# Patient Record
Sex: Male | Born: 1950 | Race: White | Hispanic: No | Marital: Single | Smoking: Never smoker
Health system: Southern US, Community
[De-identification: ages and names within clinical notes are randomized; demographics above are authoritative.]

## PROBLEM LIST (undated history)

## (undated) DIAGNOSIS — I509 Heart failure, unspecified: Secondary | ICD-10-CM

## (undated) DIAGNOSIS — M1990 Unspecified osteoarthritis, unspecified site: Secondary | ICD-10-CM

## (undated) DIAGNOSIS — N189 Chronic kidney disease, unspecified: Secondary | ICD-10-CM

## (undated) DIAGNOSIS — D649 Anemia, unspecified: Secondary | ICD-10-CM

---

## 2012-03-04 ENCOUNTER — Other Ambulatory Visit: Payer: Self-pay | Admitting: Family Medicine

## 2012-03-18 ENCOUNTER — Other Ambulatory Visit: Payer: Self-pay | Admitting: Family Medicine

## 2012-03-18 LAB — CBC WITH DIFFERENTIAL/PLATELET
Basophil #: 0.1 x10 3/mm 3 (ref 0.0–0.1)
Basophil %: 1.8 %
Eosinophil #: 0.4 x10 3/mm 3 (ref 0.0–0.7)
Eosinophil %: 7.3 %
HCT: 32.4 % — ABNORMAL LOW (ref 40.0–52.0)
HGB: 10.7 g/dL — ABNORMAL LOW (ref 13.0–18.0)
Lymphocyte %: 32.1 %
Lymphs Abs: 1.8 x10 3/mm 3 (ref 1.0–3.6)
MCH: 34 pg (ref 26.0–34.0)
MCHC: 33 g/dL (ref 32.0–36.0)
MCV: 103 fL — ABNORMAL HIGH (ref 80–100)
Monocyte #: 1 x10 3/mm (ref 0.2–1.0)
Monocyte %: 18.1 %
Neutrophil #: 2.3 x10 3/mm 3 (ref 1.4–6.5)
Neutrophil %: 40.7 %
Platelet: 233 x10 3/mm 3 (ref 150–440)
RBC: 3.15 x10 6/mm 3 — ABNORMAL LOW (ref 4.40–5.90)
RDW: 14.8 % — ABNORMAL HIGH (ref 11.5–14.5)
WBC: 5.6 x10 3/mm 3 (ref 3.8–10.6)

## 2012-03-18 LAB — BASIC METABOLIC PANEL WITH GFR
Anion Gap: 6 — ABNORMAL LOW (ref 7–16)
BUN: 12 mg/dL (ref 7–18)
Calcium, Total: 8.5 mg/dL (ref 8.5–10.1)
Chloride: 102 mmol/L (ref 98–107)
Co2: 30 mmol/L (ref 21–32)
Creatinine: 2.5 mg/dL — ABNORMAL HIGH (ref 0.60–1.30)
EGFR (African American): 27 — ABNORMAL LOW
EGFR (Non-African Amer.): 23 — ABNORMAL LOW
Glucose: 89 mg/dL (ref 65–99)
Osmolality: 275 (ref 275–301)
Potassium: 4 mmol/L (ref 3.5–5.1)
Sodium: 138 mmol/L (ref 136–145)

## 2012-06-06 ENCOUNTER — Emergency Department: Payer: Self-pay | Admitting: Emergency Medicine

## 2012-06-06 LAB — COMPREHENSIVE METABOLIC PANEL
Albumin: 3 g/dL — ABNORMAL LOW (ref 3.4–5.0)
Anion Gap: 7 (ref 7–16)
BUN: 64 mg/dL — ABNORMAL HIGH (ref 7–18)
Chloride: 100 mmol/L (ref 98–107)
Creatinine: 4.52 mg/dL — ABNORMAL HIGH (ref 0.60–1.30)
EGFR (African American): 15 — ABNORMAL LOW
EGFR (Non-African Amer.): 13 — ABNORMAL LOW
Glucose: 106 mg/dL — ABNORMAL HIGH (ref 65–99)
Potassium: 3.8 mmol/L (ref 3.5–5.1)
Sodium: 137 mmol/L (ref 136–145)
Total Protein: 8.5 g/dL — ABNORMAL HIGH (ref 6.4–8.2)

## 2012-06-06 LAB — CBC
HCT: 37.7 % — ABNORMAL LOW (ref 40.0–52.0)
HGB: 12.3 g/dL — ABNORMAL LOW (ref 13.0–18.0)
MCHC: 32.8 g/dL (ref 32.0–36.0)
RDW: 16.3 % — ABNORMAL HIGH (ref 11.5–14.5)
WBC: 11.7 10*3/uL — ABNORMAL HIGH (ref 3.8–10.6)

## 2012-08-24 ENCOUNTER — Other Ambulatory Visit: Payer: Self-pay | Admitting: Family Medicine

## 2012-08-24 LAB — PROTIME-INR: Prothrombin Time: 24.4 secs — ABNORMAL HIGH (ref 11.5–14.7)

## 2012-09-26 ENCOUNTER — Emergency Department: Payer: Self-pay | Admitting: Emergency Medicine

## 2012-09-26 LAB — PROTIME-INR
INR: 3.4
Prothrombin Time: 33.4 secs — ABNORMAL HIGH (ref 11.5–14.7)

## 2012-11-11 ENCOUNTER — Other Ambulatory Visit: Payer: Self-pay | Admitting: Family Medicine

## 2012-11-11 LAB — PROTIME-INR: Prothrombin Time: 23.6 secs — ABNORMAL HIGH (ref 11.5–14.7)

## 2012-12-07 ENCOUNTER — Other Ambulatory Visit: Payer: Self-pay | Admitting: Family Medicine

## 2012-12-07 LAB — PROTIME-INR
INR: 2.1
Prothrombin Time: 22.8 secs — ABNORMAL HIGH (ref 11.5–14.7)

## 2012-12-09 ENCOUNTER — Ambulatory Visit: Payer: Self-pay | Admitting: Nurse Practitioner

## 2013-01-09 ENCOUNTER — Emergency Department: Payer: Self-pay | Admitting: Emergency Medicine

## 2013-01-09 LAB — COMPREHENSIVE METABOLIC PANEL
Albumin: 3.2 g/dL — ABNORMAL LOW (ref 3.4–5.0)
Alkaline Phosphatase: 162 U/L — ABNORMAL HIGH
Anion Gap: 8 (ref 7–16)
BUN: 34 mg/dL — ABNORMAL HIGH (ref 7–18)
Calcium, Total: 9.3 mg/dL (ref 8.5–10.1)
Chloride: 98 mmol/L (ref 98–107)
EGFR (African American): 14 — ABNORMAL LOW
EGFR (Non-African Amer.): 12 — ABNORMAL LOW
Glucose: 118 mg/dL — ABNORMAL HIGH (ref 65–99)
Osmolality: 281 (ref 275–301)
Potassium: 3.6 mmol/L (ref 3.5–5.1)
SGOT(AST): 36 U/L (ref 15–37)
SGPT (ALT): 52 U/L (ref 12–78)
Sodium: 136 mmol/L (ref 136–145)

## 2013-01-09 LAB — CBC WITH DIFFERENTIAL/PLATELET
Basophil #: 0.1 10*3/uL (ref 0.0–0.1)
Basophil %: 0.6 %
Eosinophil %: 3 %
HCT: 34.5 % — ABNORMAL LOW (ref 40.0–52.0)
HGB: 11.7 g/dL — ABNORMAL LOW (ref 13.0–18.0)
Lymphocyte #: 2.2 10*3/uL (ref 1.0–3.6)
MCH: 36.3 pg — ABNORMAL HIGH (ref 26.0–34.0)
MCV: 107 fL — ABNORMAL HIGH (ref 80–100)
Monocyte #: 1.4 x10 3/mm — ABNORMAL HIGH (ref 0.2–1.0)
Monocyte %: 13.8 %
Neutrophil %: 61.8 %
WBC: 10.3 10*3/uL (ref 3.8–10.6)

## 2013-01-09 LAB — PROTIME-INR
INR: 1.9
Prothrombin Time: 21.6 secs — ABNORMAL HIGH (ref 11.5–14.7)

## 2013-02-16 ENCOUNTER — Ambulatory Visit: Payer: Self-pay | Admitting: Vascular Surgery

## 2013-02-16 LAB — PROTIME-INR
INR: 1.8
Prothrombin Time: 20.1 secs — ABNORMAL HIGH (ref 11.5–14.7)

## 2013-02-16 LAB — POTASSIUM: Potassium: 4 mmol/L (ref 3.5–5.1)

## 2013-04-08 ENCOUNTER — Ambulatory Visit: Payer: Self-pay | Admitting: Nurse Practitioner

## 2013-04-10 ENCOUNTER — Other Ambulatory Visit: Payer: Self-pay | Admitting: Neurosurgery

## 2014-07-17 ENCOUNTER — Ambulatory Visit: Admission: RE | Admit: 2014-07-17 | Payer: Medicare Other | Source: Ambulatory Visit | Admitting: Vascular Surgery

## 2014-07-17 ENCOUNTER — Encounter: Admission: RE | Payer: Self-pay | Source: Ambulatory Visit

## 2014-07-17 SURGERY — DIALYSIS/PERMA CATHETER INSERTION
Anesthesia: Moderate Sedation

## 2014-07-26 ENCOUNTER — Ambulatory Visit: Admission: RE | Admit: 2014-07-26 | Payer: Medicare Other | Source: Ambulatory Visit | Admitting: Vascular Surgery

## 2014-07-26 SURGERY — DIALYSIS/PERMA CATHETER REMOVAL
Anesthesia: Moderate Sedation

## 2014-08-19 ENCOUNTER — Ambulatory Visit
Admission: RE | Admit: 2014-08-19 | Discharge: 2014-08-19 | Disposition: A | Discharge status: Skilled Nursing Facility | Payer: Medicare Other | Source: Ambulatory Visit | Attending: Vascular Surgery | Admitting: Vascular Surgery

## 2014-08-19 ENCOUNTER — Encounter
Admission: RE | Disposition: A | Discharge status: Skilled Nursing Facility | Payer: Self-pay | Source: Ambulatory Visit | Attending: Vascular Surgery

## 2014-08-19 ENCOUNTER — Encounter: Payer: Self-pay | Admitting: *Deleted

## 2014-08-19 DIAGNOSIS — I509 Heart failure, unspecified: Secondary | ICD-10-CM | POA: Diagnosis not present

## 2014-08-19 DIAGNOSIS — Z79899 Other long term (current) drug therapy: Secondary | ICD-10-CM | POA: Diagnosis not present

## 2014-08-19 DIAGNOSIS — N186 End stage renal disease: Secondary | ICD-10-CM | POA: Insufficient documentation

## 2014-08-19 DIAGNOSIS — Z7901 Long term (current) use of anticoagulants: Secondary | ICD-10-CM | POA: Insufficient documentation

## 2014-08-19 DIAGNOSIS — Z992 Dependence on renal dialysis: Secondary | ICD-10-CM | POA: Diagnosis not present

## 2014-08-19 DIAGNOSIS — M1990 Unspecified osteoarthritis, unspecified site: Secondary | ICD-10-CM | POA: Diagnosis not present

## 2014-08-19 HISTORY — DX: Anemia, unspecified: D64.9

## 2014-08-19 HISTORY — DX: Heart failure, unspecified: I50.9

## 2014-08-19 HISTORY — DX: Chronic kidney disease, unspecified: N18.9

## 2014-08-19 HISTORY — DX: Unspecified osteoarthritis, unspecified site: M19.90

## 2014-08-19 HISTORY — PX: PERIPHERAL VASCULAR CATHETERIZATION: SHX172C

## 2014-08-19 HISTORY — PX: EXCHANGE OF A DIALYSIS CATHETER: SHX5818

## 2014-08-19 SURGERY — DIALYSIS/PERMA CATHETER INSERTION
Anesthesia: Moderate Sedation

## 2014-08-19 MED ORDER — HEPARIN SODIUM (PORCINE) 10000 UNIT/ML IJ SOLN
INTRAMUSCULAR | Status: AC
  Filled 2014-08-19: qty 1

## 2014-08-19 MED ORDER — SODIUM CHLORIDE 0.9 % IV SOLN: INTRAVENOUS | Status: DC

## 2014-08-19 MED ORDER — CEFAZOLIN SODIUM 1-5 GM-% IV SOLN
1.0000 g | Freq: Once | INTRAVENOUS | Status: AC
  Administered 2014-08-19: 1 g via INTRAVENOUS

## 2014-08-19 MED ORDER — FENTANYL CITRATE (PF) 100 MCG/2ML IJ SOLN
INTRAMUSCULAR | Status: DC | PRN
  Administered 2014-08-19: 50 ug via INTRAVENOUS

## 2014-08-19 MED ORDER — FENTANYL CITRATE (PF) 100 MCG/2ML IJ SOLN
INTRAMUSCULAR | Status: AC
  Filled 2014-08-19: qty 2

## 2014-08-19 MED ORDER — HEPARIN (PORCINE) IN NACL 2-0.9 UNIT/ML-% IJ SOLN
INTRAMUSCULAR | Status: AC
  Filled 2014-08-19: qty 500

## 2014-08-19 MED ORDER — MIDAZOLAM HCL 5 MG/5ML IJ SOLN
INTRAMUSCULAR | Status: AC
  Filled 2014-08-19: qty 5

## 2014-08-19 MED ORDER — LIDOCAINE-EPINEPHRINE (PF) 1 %-1:200000 IJ SOLN
INTRAMUSCULAR | Status: AC
  Filled 2014-08-19: qty 30

## 2014-08-19 MED ORDER — CEFAZOLIN SODIUM 1-5 GM-% IV SOLN
INTRAVENOUS | Status: AC
  Filled 2014-08-19: qty 50

## 2014-08-19 MED ORDER — LIDOCAINE-EPINEPHRINE (PF) 1 %-1:200000 IJ SOLN
INTRAMUSCULAR | Status: DC | PRN
  Administered 2014-08-19: 10 mL via INTRADERMAL

## 2014-08-19 MED ORDER — MIDAZOLAM HCL 2 MG/2ML IJ SOLN
INTRAMUSCULAR | Status: DC | PRN
  Administered 2014-08-19: 2 mg via INTRAVENOUS

## 2014-08-19 SURGICAL SUPPLY — 5 items
CATH PALINDROME-P 23CM W/VT (CATHETERS) ×4 IMPLANT
DRAPE BRACHIAL (DRAPES) ×4 IMPLANT
GUIDEWIRE SUPER STIFF .035X180 (WIRE) ×4 IMPLANT
PACK ANGIOGRAPHY (CUSTOM PROCEDURE TRAY) ×4 IMPLANT
TOWEL OR 17X26 4PK STRL BLUE (TOWEL DISPOSABLE) ×4 IMPLANT

## 2014-08-19 NOTE — Discharge Instructions (Signed)

## 2014-08-19 NOTE — H&P (Signed)
[facility] History & Physical Update  The patient was interviewed and re-examined.  The patient's previous History and Physical has been reviewed and is unchanged.  There is no change in the plan of care. We plan to proceed with the scheduled procedure.  Hayward Rylander, MD  08/19/2014, 8:11 AM   

## 2014-08-19 NOTE — H&P (Signed)
[facility] Admission History & Physical  MRN : [id number]  Henry Carpenter is a 64 y.o. (1950-03-05) male who presents with chief complaint of No chief complaint on file. Marland Kitchen  History of Present Illness: Patient with ESRD and a nonfunctional left jugular PermCath. He needs a new dialysis catheter for dialysis use. This has not been working well over the past week or so and the flow rates are extremely poor. He has no other complaints today.  Current Facility-Administered Medications  Medication Dose Route Frequency Provider Last Rate Last Dose  . 0.9 %  sodium chloride infusion   Intravenous Continuous Henry Dills, MD      . ceFAZolin (ANCEF) IVPB 1 g/50 mL premix  1 g Intravenous Once Henry Needy, MD        Past Medical History  Diagnosis Date  . Arthritis   . CHF (congestive heart failure)   . Anemia   . Chronic kidney disease     History reviewed. No pertinent past surgical history.  Social History History  Substance Use Topics  . Smoking status: Never Smoker   . Smokeless tobacco: Not on file  . Alcohol Use: No   lives at home.  Family History No history of bleeding disorders, clotting disorders, autoimmune diseases or aneurysms  Allergies: NKDA   REVIEW OF SYSTEMS (Negative unless checked)  Constitutional: Weight loss  Fever  Chills Cardiac: Chest pain   Chest pressure   Palpitations   Shortness of breath when laying flat   Shortness of breath at rest   Shortness of breath with exertion. Vascular:  Pain in legs with walking   Pain in legs at rest   Pain in legs when laying flat   Claudication   Pain in feet when walking  Pain in feet at rest  Pain in feet when laying flat   History of DVT   Phlebitis   Swelling in legs   Varicose veins   Non-healing ulcers Pulmonary:   Uses home oxygen   Productive cough   Hemoptysis   Wheeze  COPD   Asthma Neurologic:  Dizziness  Blackouts    Seizures   History of stroke   History of TIA  Aphasia   Temporary blindness   Dysphagia   Weakness or numbness in arms   Weakness or numbness in legs Musculoskeletal:  Arthritis   Joint swelling   Joint pain   Low back pain Hematologic:  Easy bruising  Easy bleeding   Hypercoagulable state   Anemic  Hepatitis Gastrointestinal:  Blood in stool   Vomiting blood  Gastroesophageal reflux/heartburn   Difficulty swallowing. Genitourinary:  Chronic kidney disease   Difficult urination  Frequent urination  Burning with urination   Blood in urine Skin:  Rashes   Ulcers   Wounds Psychological:  History of anxiety    History of major depression.  Physical Examination  Filed Vitals:   08/19/14 0756  BP: 96/74  Temp: 97.6 F (36.4 C)  Resp: 18  Height:  (1.854 m)  Weight: 170 lb (77.111 kg)  SpO2: 95%   Body mass index is 22.43 kg/(m^2). Gen: WD/WN, NAD Head: [place]/AT, No temporalis wasting. Prominent temp pulse not noted. Ear/Nose/Throat: Hard of hearing, nares w/o erythema or drainage, oropharynx w/o Erythema/Exudate,  Eyes: PERRLA, EOMI.  Neck: Supple, no nuchal rigidity.  No bruit or JVD.  Pulmonary:  Good air movement, clear to auscultation bilaterally, no use of accessory muscles.  Cardiac: Irregular, no murmur Vascular:  Left jugular PermCath in place. No erythema or drainage.                                          Gastrointestinal: soft, non-tender/non-distended. No guarding/reflex.  Musculoskeletal: M/S 5/5 throughout.  Extremities without ischemic changes.  No deformity or atrophy.  Neurologic: CN 2-12 intact. Pain and light touch intact in extremities.  Symmetrical.  Speech is fluent. Motor exam as listed above. Psychiatric: Judgment intact, Mood & affect appropriate for pt's clinical situation. Dermatologic: No rashes or ulcers noted.  No cellulitis or open wounds. Lymph : No Cervical, Axillary, or  Inguinal lymphadenopathy.      CBC Lab Results  Component Value Date   WBC 10.3 01/09/2013   HGB 11.7* 01/09/2013   HCT 34.5* 01/09/2013   MCV 107* 01/09/2013   PLT 294 01/09/2013    BMET    Component Value Date/Time   NA 136 01/09/2013 2019   K 4.0 02/16/2013 1127   CL 98 01/09/2013 2019   CO2 30 01/09/2013 2019   GLUCOSE 118* 01/09/2013 2019   BUN 34* 01/09/2013 2019   CREATININE 4.74* 01/09/2013 2019   CALCIUM 9.3 01/09/2013 2019   GFRNONAA 12* 01/09/2013 2019   GFRAA 14* 01/09/2013 2019   CrCl cannot be calculated (Patient has no serum creatinine result on file.).  COAG Lab Results  Component Value Date   INR 1.8 02/16/2013   INR 1.9 01/09/2013   INR 2.1 12/07/2012      Assessment/Plan ESRD: Needs functional dialysis access in her current catheters not working well. Complication of dialysis access device: Will exchange PermCath to try to get this working today. Should ultimately planned fistula or graft for long-term dialysis access.   Henry Sabino, MD  08/19/2014 9:24 AM

## 2014-08-19 NOTE — Op Note (Signed)
OPERATIVE NOTE    PRE-OPERATIVE DIAGNOSIS: 1. ESRD 2. Non-functional permcath  POST-OPERATIVE DIAGNOSIS: same as above  PROCEDURE: 1. Fluoroscopic guidance for placement of catheter 2. Placement of a 23 cm tip to cuff tunneled hemodialysis catheter via the left internal jugular vein and removal or previous catheter  SURGEON: Festus BarrenJason Royal Beirne, MD  ANESTHESIA:  Local/MCS  ESTIMATED BLOOD LOSS: minimal  FINDING(S): none  SPECIMEN(S):  None  INDICATIONS:   Henry Carpenter is a 64 y.o. male who presents with non-functional dialysis catheter and ESRD.  The patient needs long term dialysis access for their ESRD, and a Permcath is necessary.  Risks and benefits are discussed and informed consent is obtained.    DESCRIPTION: After obtaining full informed written consent, the patient was brought back to the vascular suited. The patient's existing catheter, neck and chest were sterilely prepped and draped in a sterile surgical field was created.  The existing catheter was dissected free from the fibrous sheath securing the cuff with hemostats and blunt dissection.  A wire was placed. The existing catheter was then removed and the wire used to keep venous access. I selected a 23 cm tip to cuff tunneled dialysis catheter.  Using fluoroscopic guidance the catheter tips were parked in the right atrium. The appropriate distal connectors were placed. It withdrew blood well and flushed easily with heparinized saline and a concentrated heparin solution was then placed. It was secured to the chest wall with 2 Prolene sutures. A 4-0 Monocryl pursestring suture was placed around the exit site. Sterile dressings were placed. The patient tolerated the procedure well and was taken to the recovery room in stable condition.  COMPLICATIONS: None  CONDITION: Stable  Henry Carpenter  08/19/2014, 9:51 AM

## 2014-08-20 ENCOUNTER — Encounter: Payer: Self-pay | Admitting: Vascular Surgery

## 2014-09-16 ENCOUNTER — Encounter: Payer: Self-pay | Admitting: Vascular Surgery

## 2014-11-15 ENCOUNTER — Emergency Department
Admission: EM | Admit: 2014-11-15 | Discharge: 2014-11-16 | Disposition: A | Discharge status: Home / Self Care | Payer: Medicare Other | Attending: Emergency Medicine | Admitting: Emergency Medicine

## 2014-11-15 ENCOUNTER — Other Ambulatory Visit: Payer: Self-pay

## 2014-11-15 ENCOUNTER — Emergency Department: Payer: Medicare Other

## 2014-11-15 DIAGNOSIS — J189 Pneumonia, unspecified organism: Secondary | ICD-10-CM | POA: Insufficient documentation

## 2014-11-15 DIAGNOSIS — Z79899 Other long term (current) drug therapy: Secondary | ICD-10-CM | POA: Insufficient documentation

## 2014-11-15 DIAGNOSIS — N390 Urinary tract infection, site not specified: Secondary | ICD-10-CM | POA: Diagnosis not present

## 2014-11-15 DIAGNOSIS — R4182 Altered mental status, unspecified: Secondary | ICD-10-CM | POA: Diagnosis present

## 2014-11-15 DIAGNOSIS — Z7901 Long term (current) use of anticoagulants: Secondary | ICD-10-CM | POA: Insufficient documentation

## 2014-11-15 DIAGNOSIS — Z792 Long term (current) use of antibiotics: Secondary | ICD-10-CM | POA: Diagnosis not present

## 2014-11-15 DIAGNOSIS — A419 Sepsis, unspecified organism: Secondary | ICD-10-CM | POA: Insufficient documentation

## 2014-11-15 LAB — COMPREHENSIVE METABOLIC PANEL
ALK PHOS: 106 U/L (ref 38–126)
ALT: 34 U/L (ref 17–63)
AST: 44 U/L — ABNORMAL HIGH (ref 15–41)
Albumin: 4.2 g/dL (ref 3.5–5.0)
Anion gap: 19 — ABNORMAL HIGH (ref 5–15)
BUN: 94 mg/dL — ABNORMAL HIGH (ref 6–20)
CALCIUM: 10.3 mg/dL (ref 8.9–10.3)
CO2: 26 mmol/L (ref 22–32)
CREATININE: 6.8 mg/dL — AB (ref 0.61–1.24)
Chloride: 89 mmol/L — ABNORMAL LOW (ref 101–111)
GFR, EST AFRICAN AMERICAN: 9 mL/min — AB (ref >60)
GFR, EST NON AFRICAN AMERICAN: 8 mL/min — AB (ref >60)
Glucose, Bld: 211 mg/dL — ABNORMAL HIGH (ref 65–99)
Potassium: 5 mmol/L (ref 3.5–5.1)
Sodium: 134 mmol/L — ABNORMAL LOW (ref 135–145)
Total Bilirubin: 0.8 mg/dL (ref 0.3–1.2)
Total Protein: 9.1 g/dL — ABNORMAL HIGH (ref 6.5–8.1)

## 2014-11-15 LAB — URINALYSIS COMPLETE WITH MICROSCOPIC (ARMC ONLY)
BILIRUBIN URINE: NEGATIVE
Bacteria, UA: NONE SEEN
GLUCOSE, UA: NEGATIVE mg/dL
Ketones, ur: NEGATIVE mg/dL
NITRITE: NEGATIVE
Protein, ur: 100 mg/dL — AB
SPECIFIC GRAVITY, URINE: 1.009 (ref 1.005–1.030)
pH: 9 — ABNORMAL HIGH (ref 5.0–8.0)

## 2014-11-15 LAB — CBC
HCT: 41.5 % (ref 40.0–52.0)
Hemoglobin: 13.2 g/dL (ref 13.0–18.0)
MCH: 32.3 pg (ref 26.0–34.0)
MCHC: 31.7 g/dL — ABNORMAL LOW (ref 32.0–36.0)
MCV: 102 fL — ABNORMAL HIGH (ref 80.0–100.0)
PLATELETS: 274 10*3/uL (ref 150–440)
RBC: 4.07 MIL/uL — AB (ref 4.40–5.90)
RDW: 17.6 % — ABNORMAL HIGH (ref 11.5–14.5)
WBC: 32.8 10*3/uL — AB (ref 3.8–10.6)

## 2014-11-15 LAB — PROTIME-INR
INR: 6.24 — AB
Prothrombin Time: 54.8 seconds — ABNORMAL HIGH (ref 11.4–15.0)

## 2014-11-15 LAB — TROPONIN I: TROPONIN I: 0.06 ng/mL — AB (ref <0.031)

## 2014-11-15 NOTE — ED Notes (Signed)
Pt to ED from [facility] for altered mental status. Per nurse at facility, pt is normally active and able to communicate with staff. Facility nurse states that pt's mental status has decline throught the day and has not been able to move around in his wheel chair.

## 2014-11-15 NOTE — ED Notes (Signed)
Spoke to MD about possible code sepsis for patient.  MD to talk to family about wishes d/t patient's MOST form.  No further orders from MD at this time.

## 2014-11-15 NOTE — ED Provider Notes (Addendum)
[facility] Emergency Department Provider Note  Time seen: 10:59 PM  I have reviewed the triage vital signs and the nursing notes.   HISTORY  Chief Complaint Altered Mental Status; Nausea; Hypotension; and Tachycardia    HPI Henry Carpenter is a 64 y.o. male with a past medical history of arthritis, CHF, anemia, CK D, on dialysis, presents the emergency department from his nursing facility for altered mental status. According to report the patient has become increasingly altered today appears confused and more lethargic/somnolent than normal so they sent him to the emergency department for evaluation. Patient is somnolent, arouses easily to voice, will answer questions but with questionable accuracy. No complaints.     Past Medical History  Diagnosis Date  . Arthritis   . CHF (congestive heart failure)   . Anemia   . Chronic kidney disease     There are no active problems to display for this patient.   Past Surgical History  Procedure Laterality Date  . Peripheral vascular catheterization N/A 08/19/2014    Procedure: Dialysis/Perma Catheter Insertion;  Surgeon: Annice Needy, MD;  Location: [facility] INVASIVE CV LAB;  Service: Cardiovascular;  Laterality: N/A;  . Exchange of a dialysis catheter  08/19/2014    Procedure: Exchange Of A Dialysis Catheter;  Surgeon: Annice Needy, MD;  Location: [facility] INVASIVE CV LAB;  Service: Cardiovascular;;    Current Outpatient Rx  Name  Route  Sig  Dispense  Refill  . acetaminophen (TYLENOL) 325 MG tablet   Oral   Take 650 mg by mouth every 6 (six) hours as needed.         Marland Kitchen atropine 1 % ophthalmic solution      1 drop every morning.         . carboxymethylcellulose (REFRESH PLUS) 0.5 % SOLN      1 drop every 2 (two) hours as needed.         . cinacalcet (SENSIPAR) 30 MG tablet   Oral   Take 30 mg by mouth daily.         Marland Kitchen docusate sodium (COLACE) 100 MG capsule   Oral   Take 100 mg by mouth daily.        Marland Kitchen erythromycin ophthalmic ointment      1 application 4 (four) times daily.         . famotidine (PEPCID) 20 MG tablet   Oral   Take 20 mg by mouth daily.         . midodrine (PROAMATINE) 5 MG tablet   Oral   Take 5 mg by mouth 3 (three) times daily with meals.         . Nutritional Supplements (FEEDING SUPPLEMENT, NEPRO CARB STEADY,) LIQD   Oral   Take 237 mLs by mouth every morning.         Marland Kitchen rOPINIRole (REQUIP) 2 MG tablet   Oral   Take 2 mg by mouth at bedtime.         . sevelamer carbonate (RENVELA) 800 MG tablet   Oral   Take 1,600 mg by mouth 3 (three) times daily with meals.         . sorbitol 70 % solution   Oral   Take 45 mLs by mouth daily as needed.         . tacrolimus (PROGRAF) 1 MG capsule   Oral   Take 1 mg by mouth.         . therapeutic multivitamin-minerals (  THERAGRAN-M) tablet   Oral   Take 1 tablet by mouth daily.         . valganciclovir (VALCYTE) 50 MG/ML SOLR   Oral   Take 900 mg by mouth daily.         Marland Kitchen warfarin (COUMADIN) 2 MG tablet   Oral   Take 2 mg by mouth daily.           Allergies Review of patient's allergies indicates no known allergies.  No family history on file.  Social History Social History  Substance Use Topics  . Smoking status: Never Smoker   . Smokeless tobacco: Not on file  . Alcohol Use: No    Review of Systems Unable to obtain an adequate review of systems due to altered mental status/confusion ____________________________________________   PHYSICAL EXAM:  VITAL SIGNS: ED Triage Vitals  Enc Vitals Group     BP 11/15/14 2132 84/64 mmHg     Pulse Rate 11/15/14 2132 138     Resp 11/15/14 2132 30     Temp 11/15/14 2132 98 F (36.7 C)     Temp Source 11/15/14 2132 Oral     SpO2 11/15/14 2132 96 %     Weight 11/15/14 2132 115 lb 4.8 oz (52.3 kg)     Height 11/15/14 2132 6' (1.829 m)     Head Cir --      [facility] Flow --      Pain Score --      Pain Loc --      Pain Edu?  --      Excl. in GC? --     Constitutional: Alert, awakens easily to voice. Will answer yes no questions, but with questionable accuracy. No distress. Eyes: No conjunctival injection ENT   Head: Normocephalic and atraumatic.   Mouth/Throat: Dry mucous membranes Cardiovascular: Regular rhythm rate around 140 bpm. Respiratory: Normal respiratory effort without tachypnea nor retractions. Breath sounds are clear  Gastrointestinal: Soft, moderate lower abdominal tenderness palpation. Appears to wince in pain when he presses lower abdomen. No distention. Musculoskeletal: No lower extremity edema. Neurologic:  Somnolent, but awakens easily to voice. Will answer yes no questions, follow simple commands. Skin:  Skin is warm, dry, pale   ____________________________________________    EKG  EKG reviewed and interpreted by myself shows sinus tachycardia 131 bpm, widened QRS, left axis deviation, otherwise normal intervals. Nonspecific ST changes present. No elevations noted.  ____________________________________________    RADIOLOGY  CT head shows multiple chronic infarctions with encephalomalacia. No acute findings Chest x-ray shows opacification in the left lower lobe pleural effusion versus consolidation.  ____________________________________________    INITIAL IMPRESSION / ASSESSMENT AND PLAN / ED COURSE  Pertinent labs & imaging results that were available during my care of the patient were reviewed by me and considered in my medical decision making (see chart for details).  Patient presents the emergency department altered mental status referred from his nursing facility. Patient is awake, alert, somewhat somnolent but arouses easily to voice and will answer simple questions, follow simple commands. Patient is hypotensive 80s/40s, tachycardic 130 bpm, tachypnea meeting sepsis criteria. Labs have resulted showing an elevated white blood cell count of 32. Urine appears to be  infected, troponin is mildly elevated, INR is elevated patient is currently on warfarin. Patient is likely septic from a urinary tract infection, possible consolidation on chest x-ray. Patient has a most form, which states comfort measures only and do not transfer to Hospital. I discussed this with [facility], they are aware of this form but stated that the patient's wife Henry Carpenter wish to have the patient transported to the hospital for evaluation regardless. I have discussed the patient and workup with Miss Chancy over the phone. She would like to hold off on IV antibiotics at this time until she can speak to the patient's daughters and she will call back to decide upon further care for the patient.  Patient's wife, Henry Carpenter, states that after speaking with his daughters they have come to the conclusion that they do not wish to pursue further treatment, and would like the patient transferred back to his nursing facility.  I spoke with [facility], they are able to provide comfort measures for the patient, we will discharge the patient home into their care the EMS transportation.  ____________________________________________   FINAL CLINICAL IMPRESSION(S) / ED DIAGNOSES  Urinary tract infection Pneumonia Sepsis   Minna Antis, MD 11/15/14 2322  Minna Antis, MD 11/15/14 2326

## 2014-11-15 NOTE — Discharge Instructions (Signed)
Urinary Tract Infection Urinary tract infections (UTIs) can develop anywhere along your urinary tract. Your urinary tract is your body's drainage system for removing wastes and extra water. Your urinary tract includes two kidneys, two ureters, a bladder, and a urethra. Your kidneys are a pair of bean-shaped organs. Each kidney is about the size of your fist. They are located below your ribs, one on each side of your spine. CAUSES Infections are caused by microbes, which are microscopic organisms, including fungi, viruses, and bacteria. These organisms are so small that they can only be seen through a microscope. Bacteria are the microbes that most commonly cause UTIs. SYMPTOMS  Symptoms of UTIs may vary by age and gender of the patient and by the location of the infection. Symptoms in young women typically include a frequent and intense urge to urinate and a painful, burning feeling in the bladder or urethra during urination. Older women and men are more likely to be tired, shaky, and weak and have muscle aches and abdominal pain. A fever may mean the infection is in your kidneys. Other symptoms of a kidney infection include pain in your back or sides below the ribs, nausea, and vomiting. DIAGNOSIS To diagnose a UTI, your caregiver will ask you about your symptoms. Your caregiver will also ask you to provide a urine sample. The urine sample will be tested for bacteria and white blood cells. White blood cells are made by your body to help fight infection. TREATMENT  Typically, UTIs can be treated with medication. Because most UTIs are caused by a bacterial infection, they usually can be treated with the use of antibiotics. The choice of antibiotic and length of treatment depend on your symptoms and the type of bacteria causing your infection. HOME CARE INSTRUCTIONS  If you were prescribed antibiotics, take them exactly as your caregiver instructs you. Finish the medication even if you feel better after  you have only taken some of the medication.  Drink enough water and fluids to keep your urine clear or pale yellow.  Avoid caffeine, tea, and carbonated beverages. They tend to irritate your bladder.  Empty your bladder often. Avoid holding urine for long periods of time.  Empty your bladder before and after sexual intercourse.  After a bowel movement, women should cleanse from front to back. Use each tissue only once. SEEK MEDICAL CARE IF:   You have back pain.  You develop a fever.  Your symptoms do not begin to resolve within 3 days. SEEK IMMEDIATE MEDICAL CARE IF:   You have severe back pain or lower abdominal pain.  You develop chills.  You have nausea or vomiting.  You have continued burning or discomfort with urination. MAKE SURE YOU:   Understand these instructions.  Will watch your condition.  Will get help right away if you are not doing well or get worse.   This information is not intended to replace advice given to you by your health care provider. Make sure you discuss any questions you have with your health care provider.   Document Released: 11/04/2004 Document Revised: 10/16/2014 Document Reviewed: 03/05/2011 Elsevier Interactive Patient Education 2016 [employer] Inc[employer]  Sepsis, Adult Sepsis is a serious infection of your blood or tissues that affects your whole body. The infection that causes sepsis may be bacterial, viral, fungal, or parasitic. Sepsis may be life threatening. Sepsis can cause your blood pressure to drop. This may result in shock. Shock causes your central nervous system and your organs to stop working correctly.  RISK FACTORS Sepsis can happen in anyone, but it is more likely to happen in people who have weakened immune systems. SIGNS AND SYMPTOMS  Symptoms of sepsis can include:  Fever or low body temperature (hypothermia).  Rapid breathing (hyperventilation).  Chills.  Rapid heartbeat (tachycardia).  Confusion or  light-headedness.  Trouble breathing.  Urinating much less than usual.  Cool, clammy skin or red, flushed skin.  Other problems with the heart, kidneys, or brain. DIAGNOSIS  Your health care provider will likely do tests to look for an infection, to see if the infection has spread to your blood, and to see how serious your condition is. Tests can include:  Blood tests, including cultures of your blood.  Cultures of other fluids from your body, such as:  Urine.  Pus from wounds.  Mucus coughed up from your lungs.  Urine tests other than cultures.  X-ray exams or other imaging tests. TREATMENT  Treatment will begin with elimination of the source of infection. If your sepsis is likely caused by a bacterial or fungal infection, you will be given antibiotic or antifungal medicines. You may also receive:  Oxygen.  Fluids through an IV tube.  Medicines to increase your blood pressure.  A machine to clean your blood (dialysis) if your kidneys fail.  A machine to help you breathe if your lungs fail. SEEK IMMEDIATE MEDICAL CARE IF: You get an infection or develop any of the signs and symptoms of sepsis after surgery or a hospitalization.   This information is not intended to replace advice given to you by your health care provider. Make sure you discuss any questions you have with your health care provider.   Document Released: 10/24/2002 Document Revised: 06/11/2014 Document Reviewed: 10/02/2012 Elsevier Interactive Patient Education [employer]

## 2014-11-16 NOTE — ED Notes (Signed)
Spoke with Jill, RN [facility], report given to [facility] about pt.  Informed that [facility] is unable to transport pt back.

## 2014-12-10 DEATH — deceased

## 2015-08-05 IMAGING — CT CT HEAD WITHOUT CONTRAST
2 series · 16 of 30 positions shown, 20 images · non-contrast
Comparison: none

REASON FOR EXAM: fall injury on coumadin
COMMENTS:

[Series 2: without · axial · non-contrast · 0.45mm/px · z∈[+531,+666]mm · 13 of 33 slices shown, 17 images]
[im 3/33  brain]
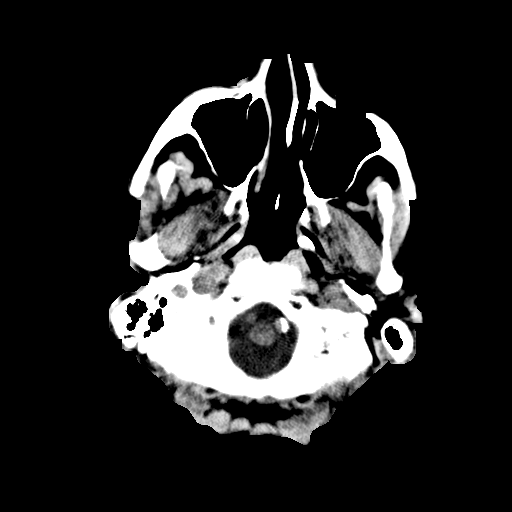
[im 3/33  bone]
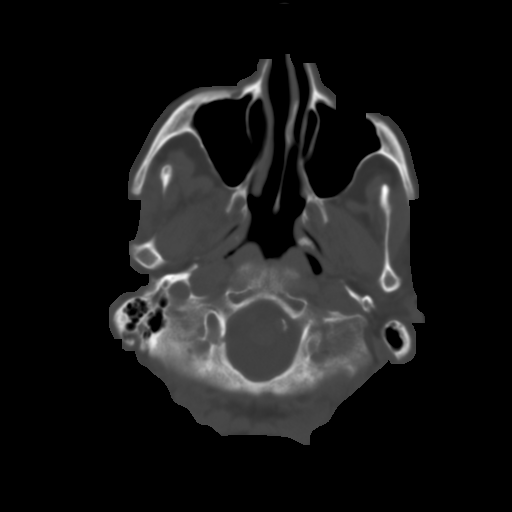
[im 5/33  brain]
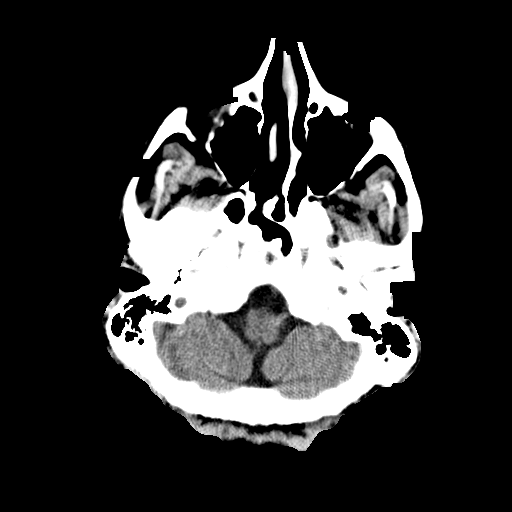
[im 7/33  brain]
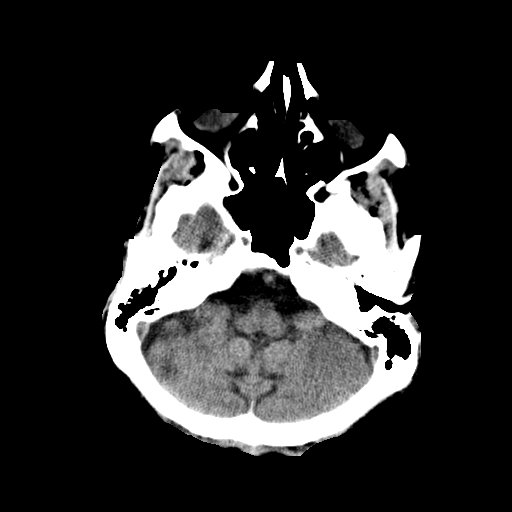
[im 10/33  brain]
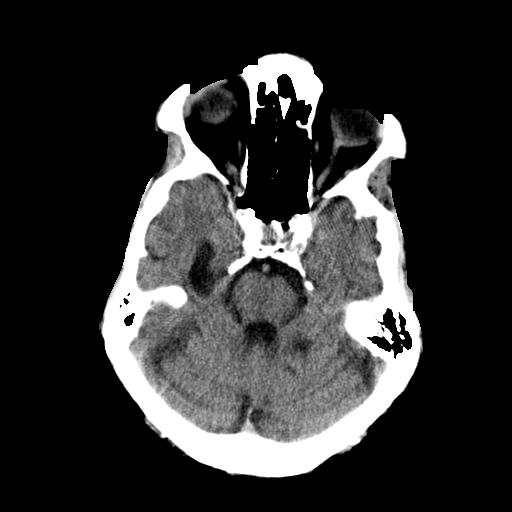
[im 12/33  brain]
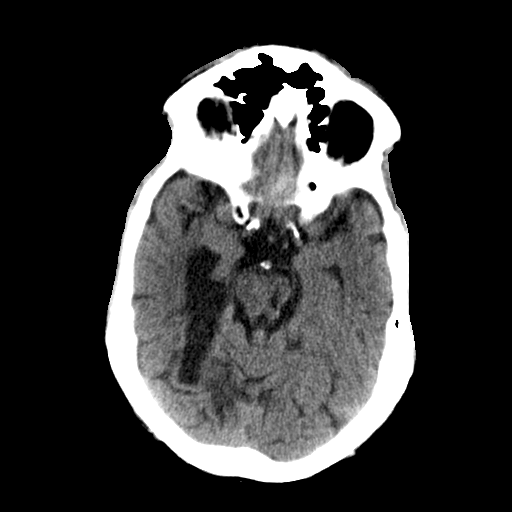
[im 12/33  bone]
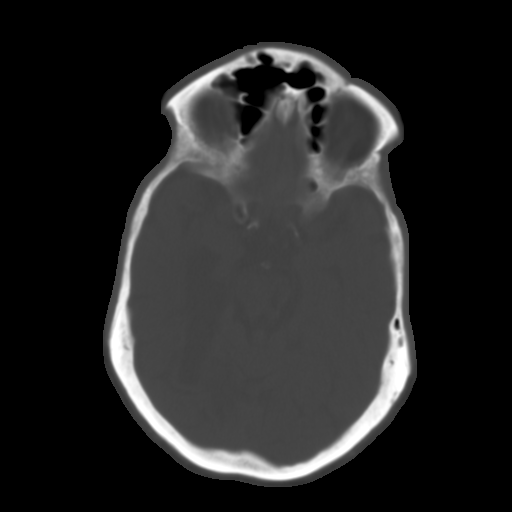
[im 14/33  brain]
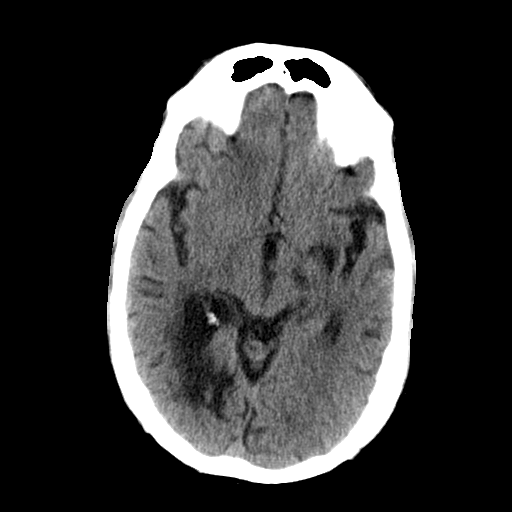
[im 17/33  brain]
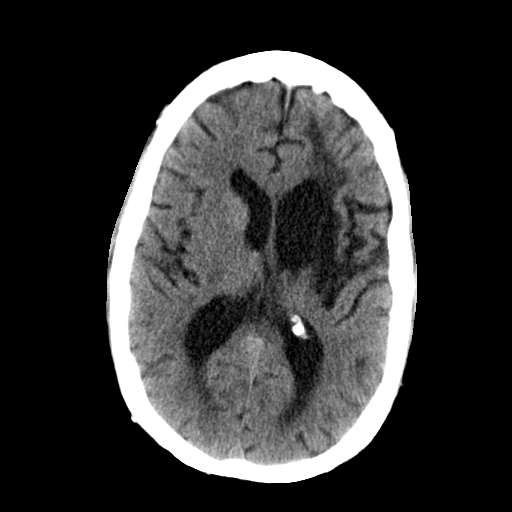
[im 19/33  brain]
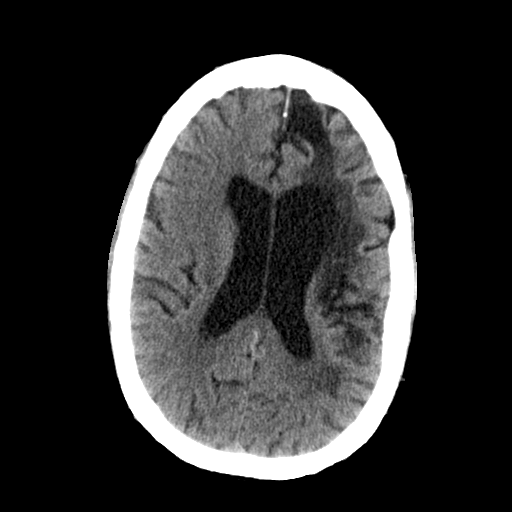
[im 21/33  brain]
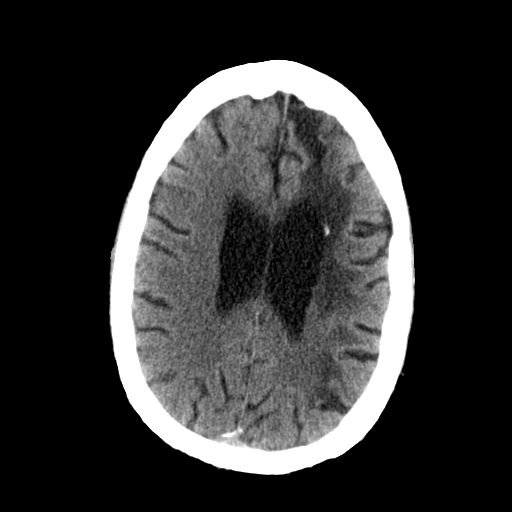
[im 21/33  bone]
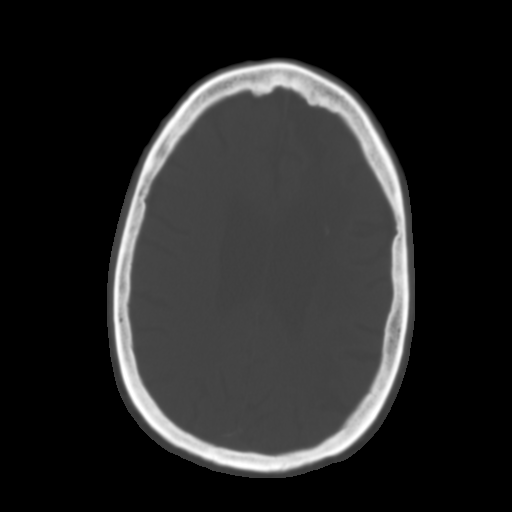
[im 23/33  brain]
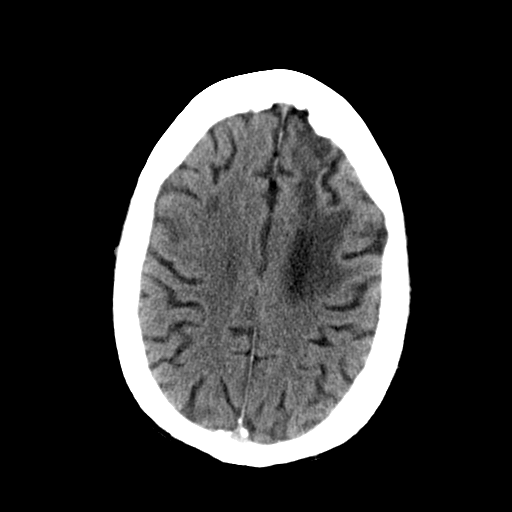
[im 26/33  brain]
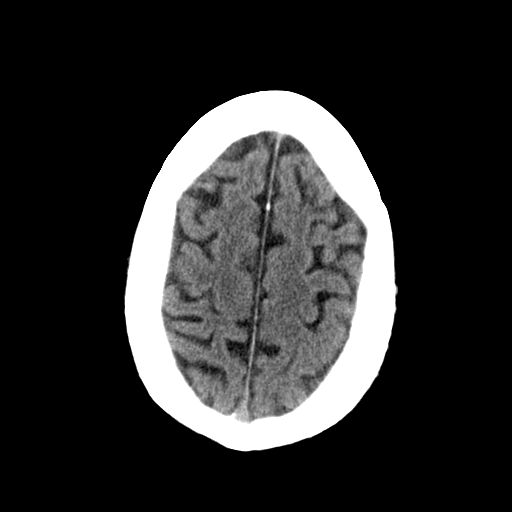
[im 28/33  brain]
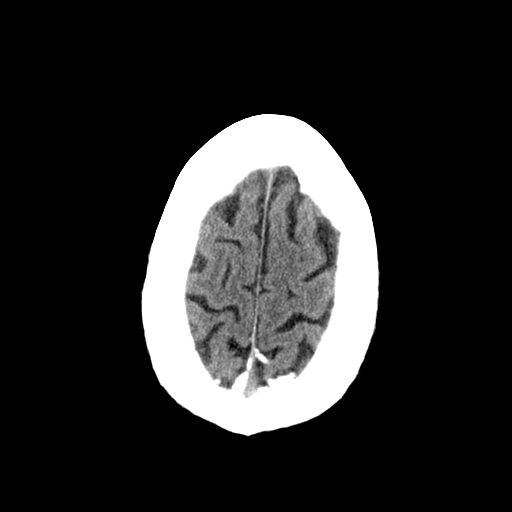
[im 30/33  brain]
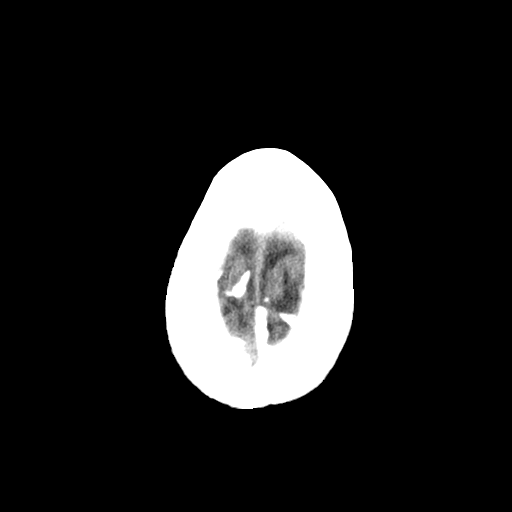
[im 30/33  bone]
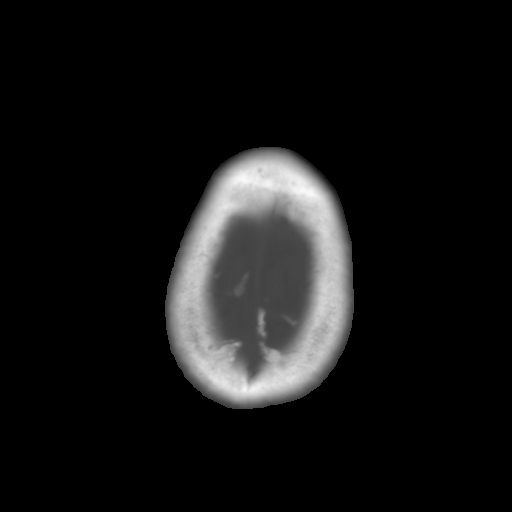

[Series 3: bone · axial · 0.45mm/px · z∈[+531,+576]mm · 3 of 33 slices shown]
[im 3/33  bone]
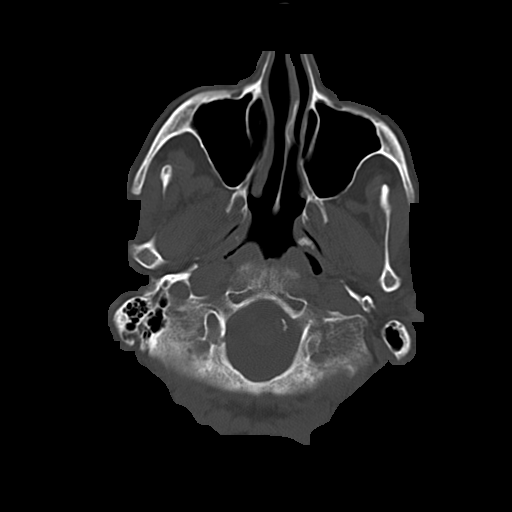
[im 7/33  bone]
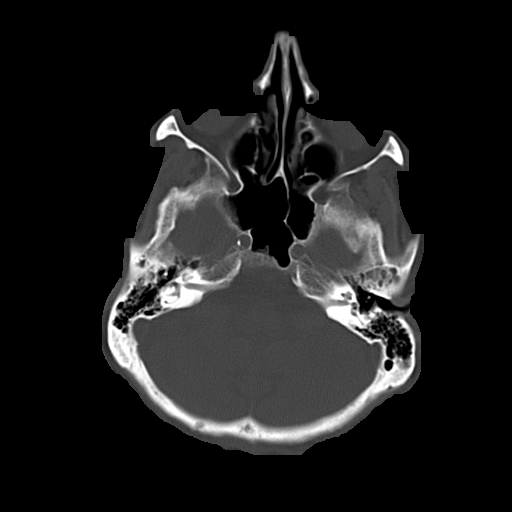
[im 12/33  bone]
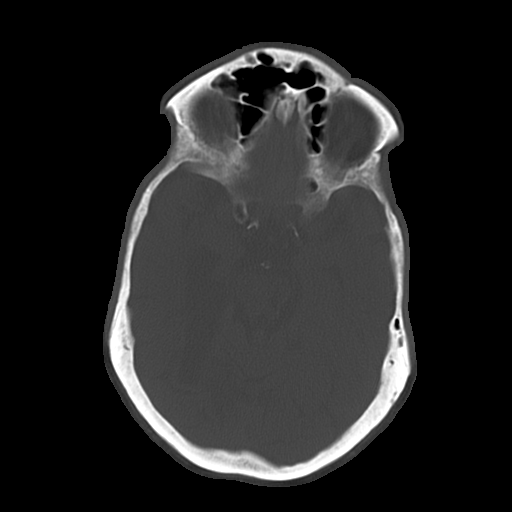

[16 of 30 positions shown; findings below may reference images not displayed]

PROCEDURE:     CT  - CT HEAD WITHOUT CONTRAST  - September 26, 2012  [DATE]

RESULT:     CT of the brain is compared to the study of 06/06/2012. There is
prominence of the ventricles and sulci consistent with atrophy. There is
encephalomalacia in the left frontal and parietal regions. Ex vacuo
prominence of the left lateral ventricle is seen. There is no evidence of
intracranial hemorrhage. There is no mass or mass effect. There is some
low-attenuation in the right cerebellar hemisphere. Prominence of the
temporal horn of the right lateral ventricle is noted. The appearance is
unchanged. The sinuses and mastoid air cells show normal appearing aeration.
The calvarium appears unremarkable.
IMPRESSION: 1. Stable CT of the brain with multiple areas of encephalomalacia. No acute
intracranial abnormality. Chronic ischemic changes are present.

[REDACTED]

## 2017-09-23 IMAGING — CT CT HEAD W/O CM
2 series · 16 of 30 positions shown, 20 images · non-contrast
Comparison: 01/09/2013

CLINICAL DATA: Altered mental status.

EXAM:
CT HEAD WITHOUT CONTRAST
TECHNIQUE: Contiguous axial images were obtained from the base of the skull
through the vertex without intravenous contrast.

[Series 2: soft tissue · axial · 0.46mm/px · z∈[+298,+443]mm · 14 of 35 slices shown, 18 images]
[im 3/35  brain]
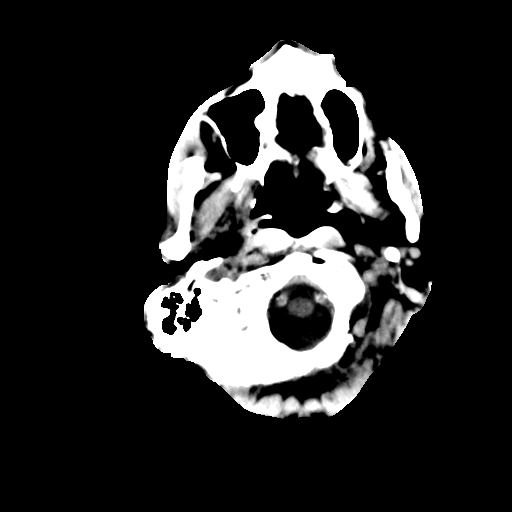
[im 3/35  bone]
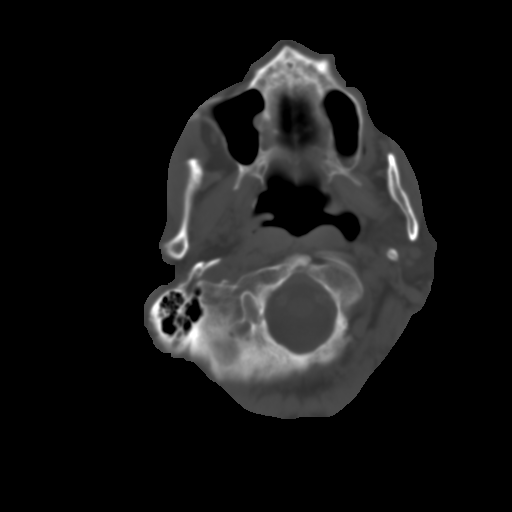
[im 5/35  brain]
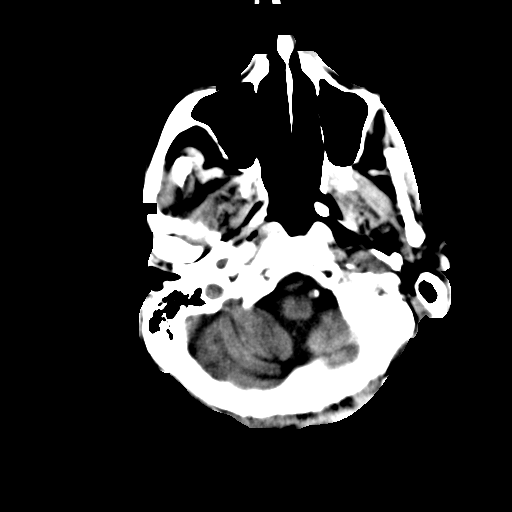
[im 7/35  brain]
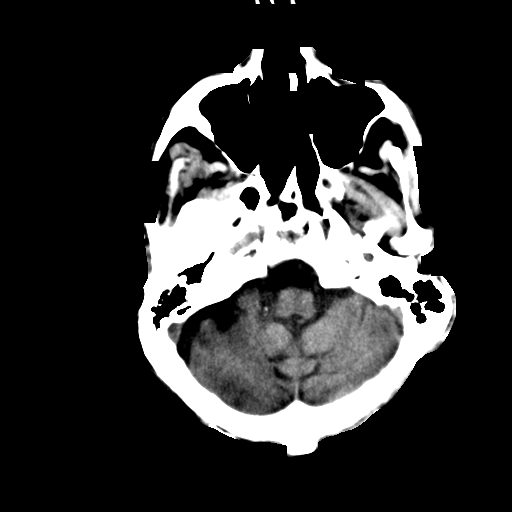
[im 10/35  brain]
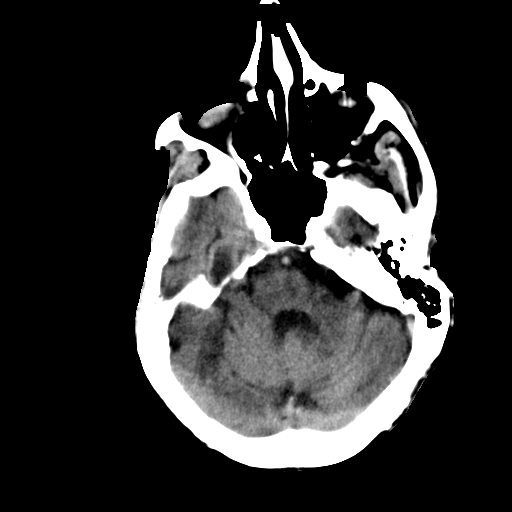
[im 12/35  brain]
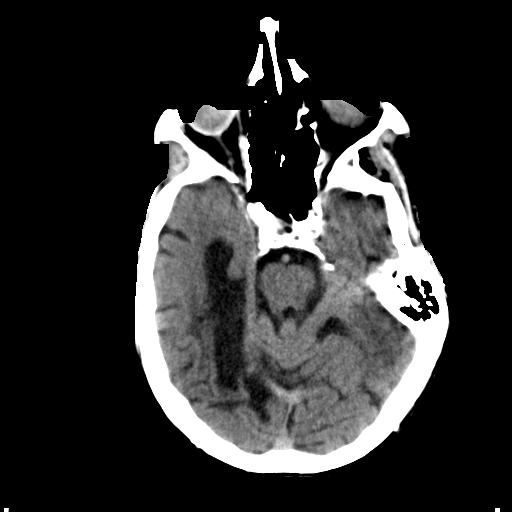
[im 12/35  bone]
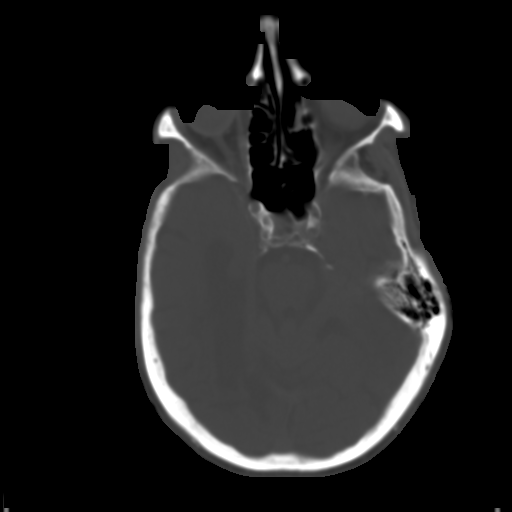
[im 14/35  brain]
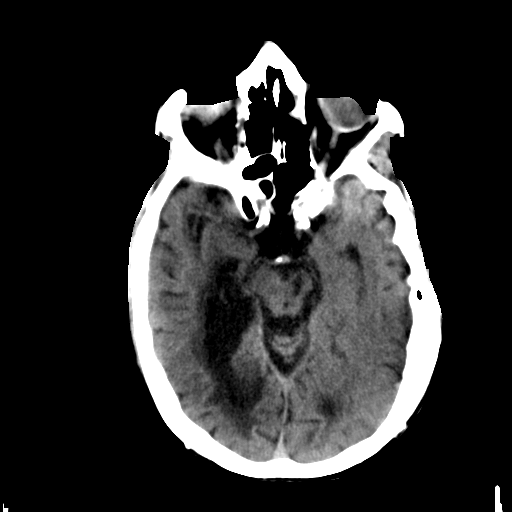
[im 16/35  brain]
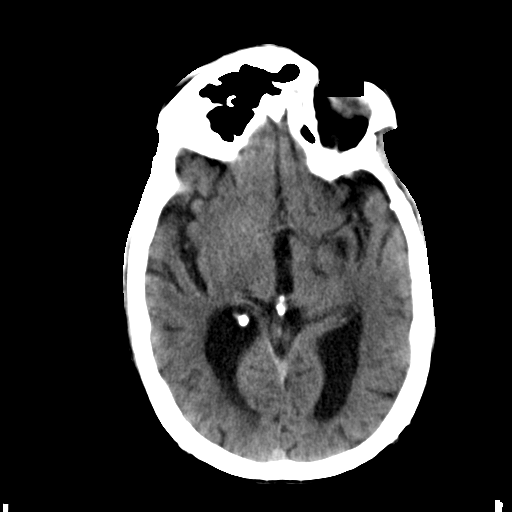
[im 19/35  brain]
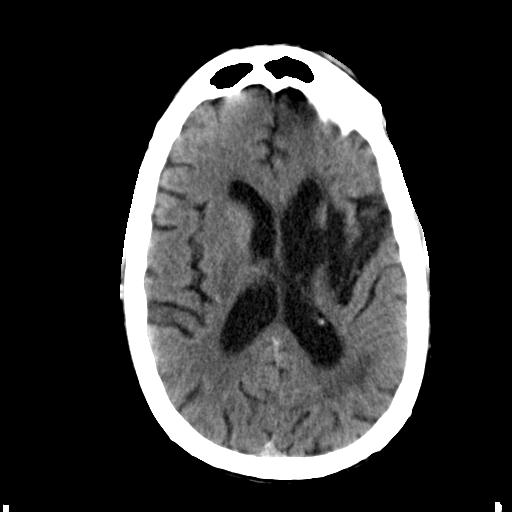
[im 21/35  brain]
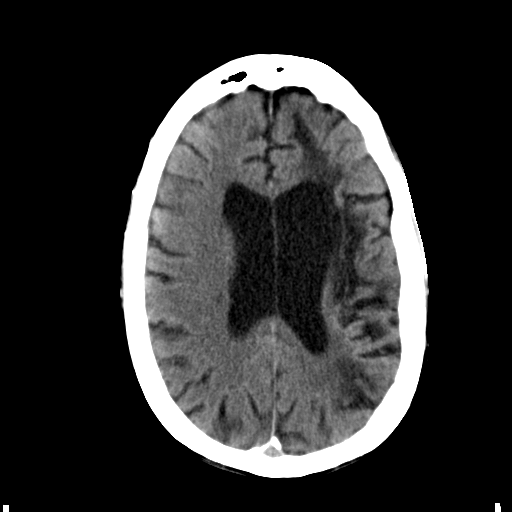
[im 21/35  bone]
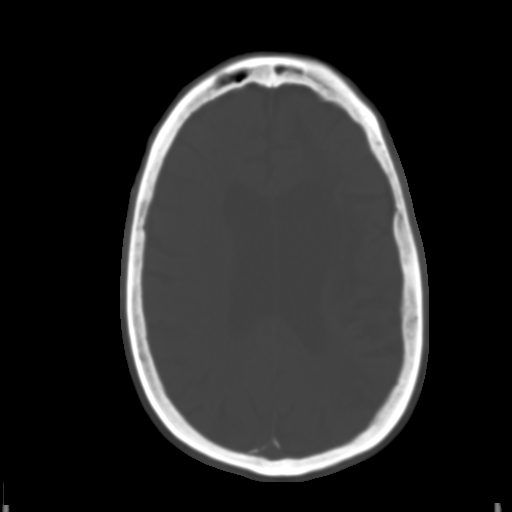
[im 23/35  brain]
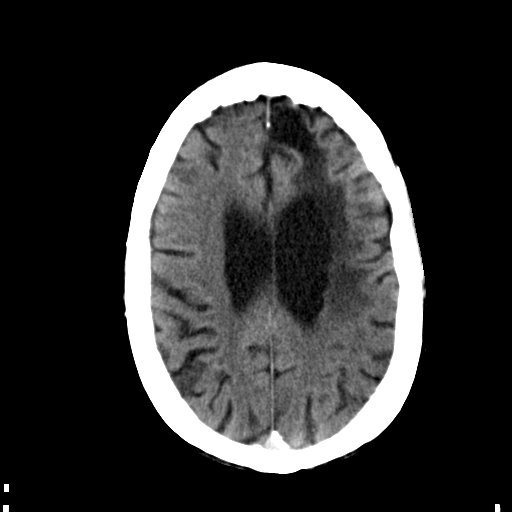
[im 25/35  brain]
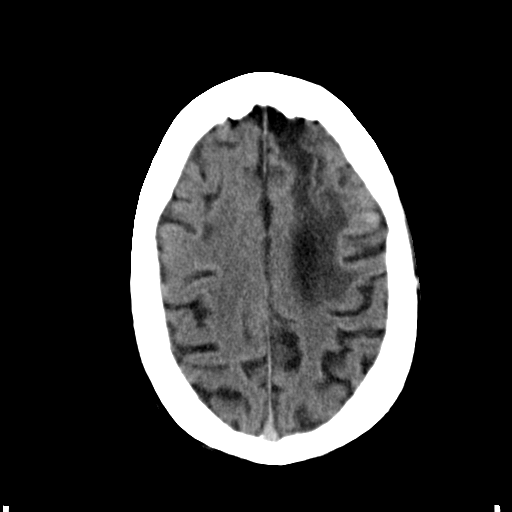
[im 28/35  brain]
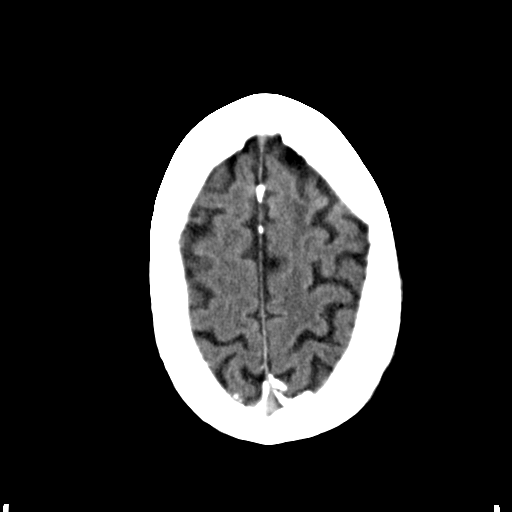
[im 30/35  brain]
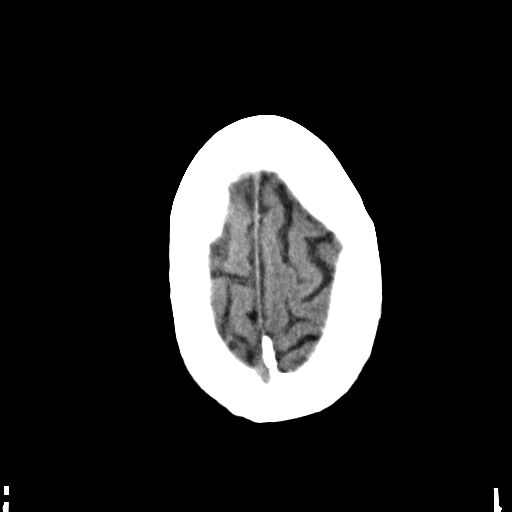
[im 30/35  bone]
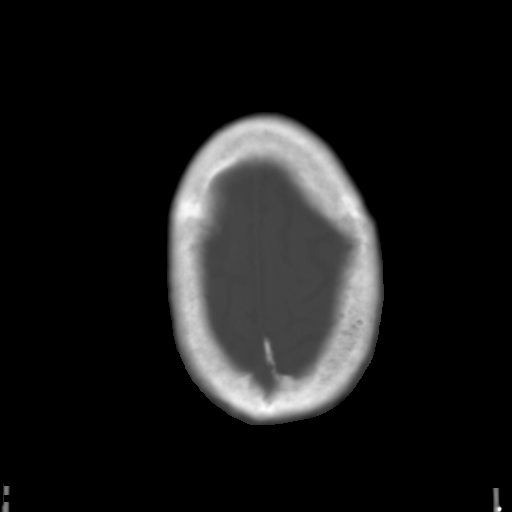
[im 32/35  brain]
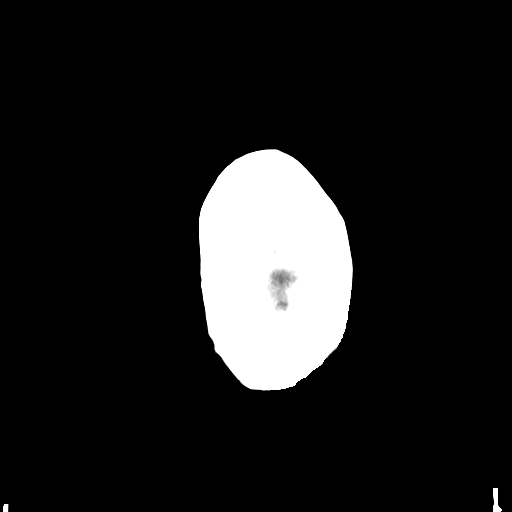

[Series 4: soft tissue recon · axial · 0.46mm/px · z∈[+378,+396]mm · 2 of 31 slices shown]
[im 3/31  brain]
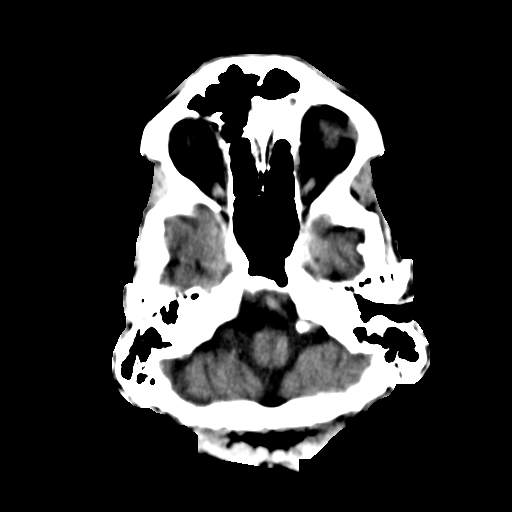
[im 7/31  brain]
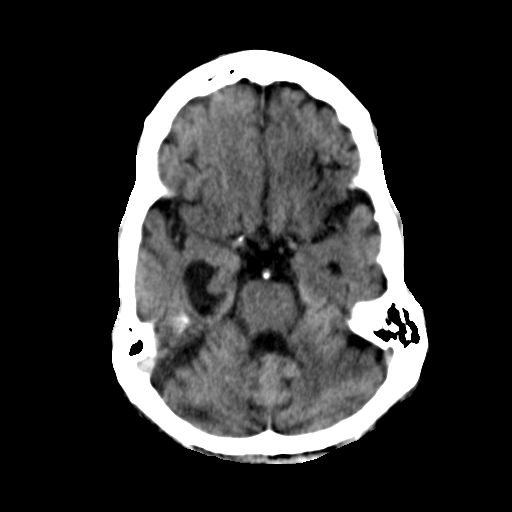

[16 of 30 positions shown; findings below may reference images not displayed]

FINDINGS: There is no intracranial hemorrhage, mass or evidence of acute
infarction. There is encephalomalacia due to prior infarction in the
left frontal, left temporal, left parietal and right occipital
regions as well as the left lentiform nuclei and left cerebral
peduncle. These are chronic infarctions, unchanged from 01/09/2013.
There is no significant interval change. There is no extra-axial
fluid collection. There is generalized atrophy and chronic small
vessel disease.

There is no significant bony abnormality. The visible paranasal
sinuses are clear.
IMPRESSION: Multiple chronic infarctions with encephalomalacia. Generalized
atrophy and small vessel disease. No superimposed acute findings.
